# Patient Record
Sex: Female | Born: 1935 | Race: White | Hispanic: No | Smoking: Former smoker
Health system: Southern US, Community
[De-identification: ages and names within clinical notes are randomized; demographics above are authoritative.]

## PROBLEM LIST (undated history)

## (undated) DIAGNOSIS — I10 Essential (primary) hypertension: Secondary | ICD-10-CM

## (undated) DIAGNOSIS — Q782 Osteopetrosis: Secondary | ICD-10-CM

## (undated) DIAGNOSIS — J449 Chronic obstructive pulmonary disease, unspecified: Secondary | ICD-10-CM

## (undated) HISTORY — PX: TONSILLECTOMY: SUR1361

---

## 2009-07-02 ENCOUNTER — Emergency Department (HOSPITAL_COMMUNITY): Admission: EM | Admit: 2009-07-02 | Discharge: 2009-07-02 | Payer: Self-pay | Admitting: Emergency Medicine

## 2009-07-02 ENCOUNTER — Ambulatory Visit: Payer: Self-pay | Admitting: Vascular Surgery

## 2009-07-02 ENCOUNTER — Encounter (INDEPENDENT_AMBULATORY_CARE_PROVIDER_SITE_OTHER): Payer: Self-pay | Admitting: Emergency Medicine

## 2010-10-12 LAB — BASIC METABOLIC PANEL
Chloride: 101 mEq/L (ref 96–112)
GFR calc non Af Amer: >60 mL/min (ref >60)
Potassium: 3.9 mEq/L (ref 3.5–5.1)
Sodium: 132 mEq/L — ABNORMAL LOW (ref 135–145)

## 2010-10-12 LAB — DIFFERENTIAL
Lymphocytes Relative: 17 % (ref 12–46)
Lymphs Abs: 1.8 10*3/uL (ref 0.7–4.0)
Monocytes Absolute: 1 10*3/uL (ref 0.1–1.0)
Monocytes Relative: 10 % (ref 3–12)
Neutro Abs: 7.6 10*3/uL (ref 1.7–7.7)

## 2010-10-12 LAB — CBC
Hemoglobin: 13.3 g/dL (ref 12.0–15.0)
RBC: 4.3 MIL/uL (ref 3.87–5.11)

## 2021-01-21 ENCOUNTER — Encounter: Payer: Self-pay | Admitting: *Deleted

## 2021-01-21 ENCOUNTER — Emergency Department (INDEPENDENT_AMBULATORY_CARE_PROVIDER_SITE_OTHER)
Admission: EM | Admit: 2021-01-21 | Discharge: 2021-01-21 | Disposition: A | Discharge status: Home / Self Care | Payer: Medicare Other | Source: Home / Self Care | Attending: Family Medicine | Admitting: Family Medicine

## 2021-01-21 ENCOUNTER — Emergency Department (INDEPENDENT_AMBULATORY_CARE_PROVIDER_SITE_OTHER): Payer: Medicare Other

## 2021-01-21 DIAGNOSIS — M25532 Pain in left wrist: Secondary | ICD-10-CM

## 2021-01-21 DIAGNOSIS — M25541 Pain in joints of right hand: Secondary | ICD-10-CM | POA: Diagnosis not present

## 2021-01-21 DIAGNOSIS — W19XXXA Unspecified fall, initial encounter: Secondary | ICD-10-CM | POA: Diagnosis not present

## 2021-01-21 DIAGNOSIS — M1811 Unilateral primary osteoarthritis of first carpometacarpal joint, right hand: Secondary | ICD-10-CM

## 2021-01-21 DIAGNOSIS — M1120 Other chondrocalcinosis, unspecified site: Secondary | ICD-10-CM

## 2021-01-21 DIAGNOSIS — F05 Delirium due to known physiological condition: Secondary | ICD-10-CM

## 2021-01-21 HISTORY — DX: Osteopetrosis: Q78.2

## 2021-01-21 HISTORY — DX: Chronic obstructive pulmonary disease, unspecified: J44.9

## 2021-01-21 HISTORY — DX: Essential (primary) hypertension: I10

## 2021-01-21 MED ORDER — PREDNISONE 10 MG (21) PO TBPK: ORAL_TABLET | Freq: Every day | ORAL | 0 refills | Status: AC

## 2021-01-21 NOTE — Discharge Instructions (Addendum)
Limit use of hand Try ice for 20 minutes every 4 hours to reduce swelling, pain, and inflammation Take the prednisone pack as discussed.  You may take all of the pills at once each day Call or return if this does not improve

## 2021-01-21 NOTE — ED Triage Notes (Signed)
Patient reports falling about 3 days ago injuring her left hand/wrist. The site is red, warm and edematous. She is able to move her fingers. She has bruising at her right brow. No previous injury to the left hand. She is visiting here daughter and son in law from out of town. Ice applied. Her son in law reports her hand was not red or swollen yesterday. She was disoriented last night, she wound up in the wrong bed with her pants pulled down. She does not remember this. Denies any new medications.

## 2021-01-21 NOTE — ED Provider Notes (Signed)
[facility]    CSN: [id number] Arrival date & time: 01/21/21  1856      History   Chief Complaint Chief Complaint  Patient presents with   Hand Injury    left    HPI Angela Drake is a 86 y.o. female.   HPI  Angela Drake is here for left hand and wrist pain.  She awoke today with that acutely swollen, red, and very painful.  She has known arthritis in that hand.  She had a fall 2 or 3 days ago.  She is currently staying with her daughter and son-in-law.  The son-in-law brings her in.  She has a large bruise by her right eye.  She denies any headache, head injury, nausea vomiting, or increased trouble with ambulation or balance.  She comes in in a wheelchair.  She is frail and unsteady at the baseline.  She is on chronic oxygen from COPD.  She has been living in [place] with family, and is now moving to this area.  They are touring assisted living centers. Son said she is also had a couple episodes of acute confusion.  They woke up in the night to find her sitting onto the edge of their bed partially dressed.  The son does note that her oxygen compressor tripped a circuit and was off at that time.  I explained that hypoxia would make her confused, as well as being in a new place that is unfamiliar. Limited old records are available  Past Medical History:  Diagnosis Date   COPD (chronic obstructive pulmonary disease) (HCC)    Hypertension    Osteopetrosis     There are no problems to display for this patient.   Past Surgical History:  Procedure Laterality Date   TONSILLECTOMY      OB History   No obstetric history on file.      Home Medications    Prior to Admission medications   Medication Sig Start Date End Date Taking? Authorizing Provider  albuterol (VENTOLIN HFA) 108 (90 Base) MCG/ACT inhaler  12/15/16  Yes [provider]  dilTIAZem HCl Coated Beads (DILTIAZEM CD PO) Take by mouth.   Yes [provider]  losartan (COZAAR) 100 MG  tablet  01/21/17  Yes [provider]  predniSONE (STERAPRED UNI-PAK 21 TAB) 10 MG (21) TBPK tablet Take by mouth daily. tad 01/21/21  Yes Eustace Moore, MD  valACYclovir HCl (VALTREX PO) Take by mouth.   Yes [provider]    Family History History reviewed. No pertinent family history.  Social History Social History   Tobacco Use   Smoking status: Former    Pack years: 0.00    Types: Cigarettes   Smokeless tobacco: Never  Vaping Use   Vaping Use: Never used  Substance Use Topics   Alcohol use: Never   Drug use: Never     Allergies   Patient has no known allergies.   Review of Systems Review of Systems See HPI  Physical Exam Triage Vital Signs ED Triage Vitals  Enc Vitals Group     BP 01/21/21 0836 117/83     Pulse Rate 01/21/21 0836 92     Resp 01/21/21 0836 16     Temp 01/21/21 0836 98.6 F (37 C)     Temp Source 01/21/21 0836 Oral     SpO2 01/21/21 0836 93 %     Weight --      Height --      Head  Circumference --      Peak Flow --      Pain Score 01/21/21 0838 4     Pain Loc --      Pain Edu? --      Excl. in GC? --    No data found.  Updated Vital Signs BP 117/83 (BP Location: Right Arm)   Pulse 92   Temp 98.6 F (37 C) (Oral)   Resp 16   SpO2 93%       Physical Exam Constitutional:      General: She is not in acute distress.    Appearance: She is well-developed.     Comments: Patient is thin and frail.  Obvious kyphosis.  Examined in wheelchair.  Cradling left hand close to body  HENT:     Head: Normocephalic and atraumatic.  Eyes:     Conjunctiva/sclera: Conjunctivae normal.     Pupils: Pupils are equal, round, and reactive to light.     Comments: Bruise to the side of right eye.  PERRL.  Patient states vision is intact.  No palpable defect, minimal tenderness  Cardiovascular:     Rate and Rhythm: Normal rate.  Pulmonary:     Effort: Pulmonary effort is normal. No respiratory distress.  Abdominal:      General: There is no distension.     Palpations: Abdomen is soft.  Musculoskeletal:        General: Normal range of motion.     Cervical back: Normal range of motion.     Comments: There is swelling erythema and tenderness around the radial aspect of the wrist and distal thumb metacarpal.  Much pain with any palpation.  Pain with any thumb movement or wrist movement.  No bony tenderness  Skin:    General: Skin is warm and dry.     Findings: Erythema present.  Neurological:     Mental Status: She is alert.  Psychiatric:        Mood and Affect: Mood normal.        Behavior: Behavior normal.      UC Treatments / Results  Labs (all labs ordered are listed, but only abnormal results are displayed) Labs Reviewed - No data to display  EKG   Radiology DG Wrist Complete Left  Result Date: 01/21/2021 CLINICAL DATA:  Status post fall.  Pain. EXAM: LEFT WRIST - COMPLETE 3+ VIEW COMPARISON:  None. FINDINGS: No acute fracture or dislocation. No aggressive osseous lesion. Normal alignment. Generalized osteopenia. Severe osteoarthritis of the first [facility] joint with dorsal subluxation. Chondrocalcinosis of the TFCC as can be seen with CPPD. Soft tissue are unremarkable. No radiopaque foreign body or soft tissue emphysema. IMPRESSION: 1.  No acute osseous injury of the left wrist. 2. Severe osteoarthritis of the first [facility] joint with dorsal subluxation. Electronically Signed   By: Elige Ko   On: 01/21/2021 09:32    Procedures Procedures (including critical care time)  Medications Ordered in UC Medications - No data to display  Initial Impression / Assessment and Plan / UC Course  I have reviewed the triage vital signs and the nursing notes.  Pertinent labs & imaging results that were available during my care of the patient were reviewed by me and considered in my medical decision making (see chart for details).     Explained calcium pyrophosphate disease causing her some of her arthritic  changes.  Calcium deposits in the cartilage.  This is likely inflamed, possibly due to her fall.  I  think her wrist has an acute inflammatory reaction and not occult fracture We also discussed causes of acute confusion.  She is fine now.  She had taken lorazepam and was not on oxygen when she was confused.  Son will correct these. Final Clinical Impressions(s) / UC Diagnoses   Final diagnoses:  Pain, joint, hand, right  Primary osteoarthritis of first carpometacarpal joint of right hand  Calcium pyrophosphate deposition disease (CPPD)  Acute confusional state     Discharge Instructions      Limit use of hand Try ice for 20 minutes every 4 hours to reduce swelling, pain, and inflammation Take the prednisone pack as discussed.  You may take all of the pills at once each day Call or return if this does not improve     ED Prescriptions     Medication Sig Dispense Auth. Provider   predniSONE (STERAPRED UNI-PAK 21 TAB) 10 MG (21) TBPK tablet Take by mouth daily. tad 21 tablet Eustace Moore, MD      PDMP not reviewed this encounter.   Eustace Moore, MD 01/21/21 [phone]

## 2022-12-26 IMAGING — DX DG WRIST COMPLETE 3+V*L*
4 series · 4 of 4 positions shown · non-contrast
Comparison: None.

CLINICAL DATA: Status post fall.  Pain.

EXAM:
LEFT WRIST - COMPLETE 3+ VIEW

[wrist pa]
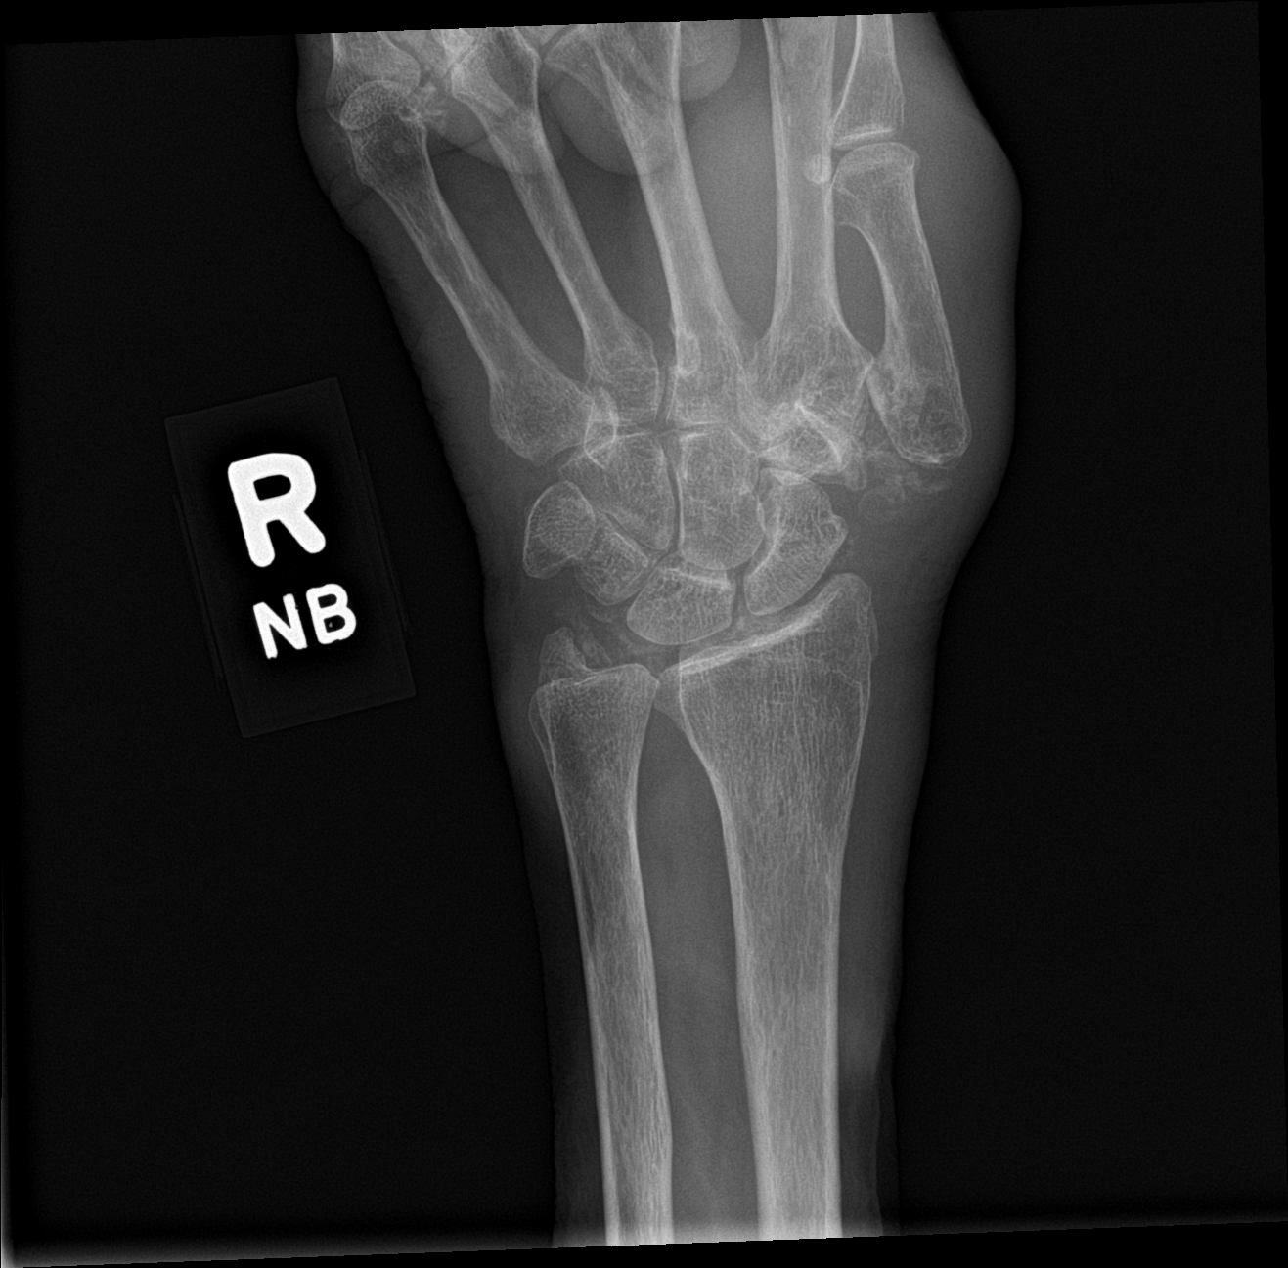

[wrist obl]
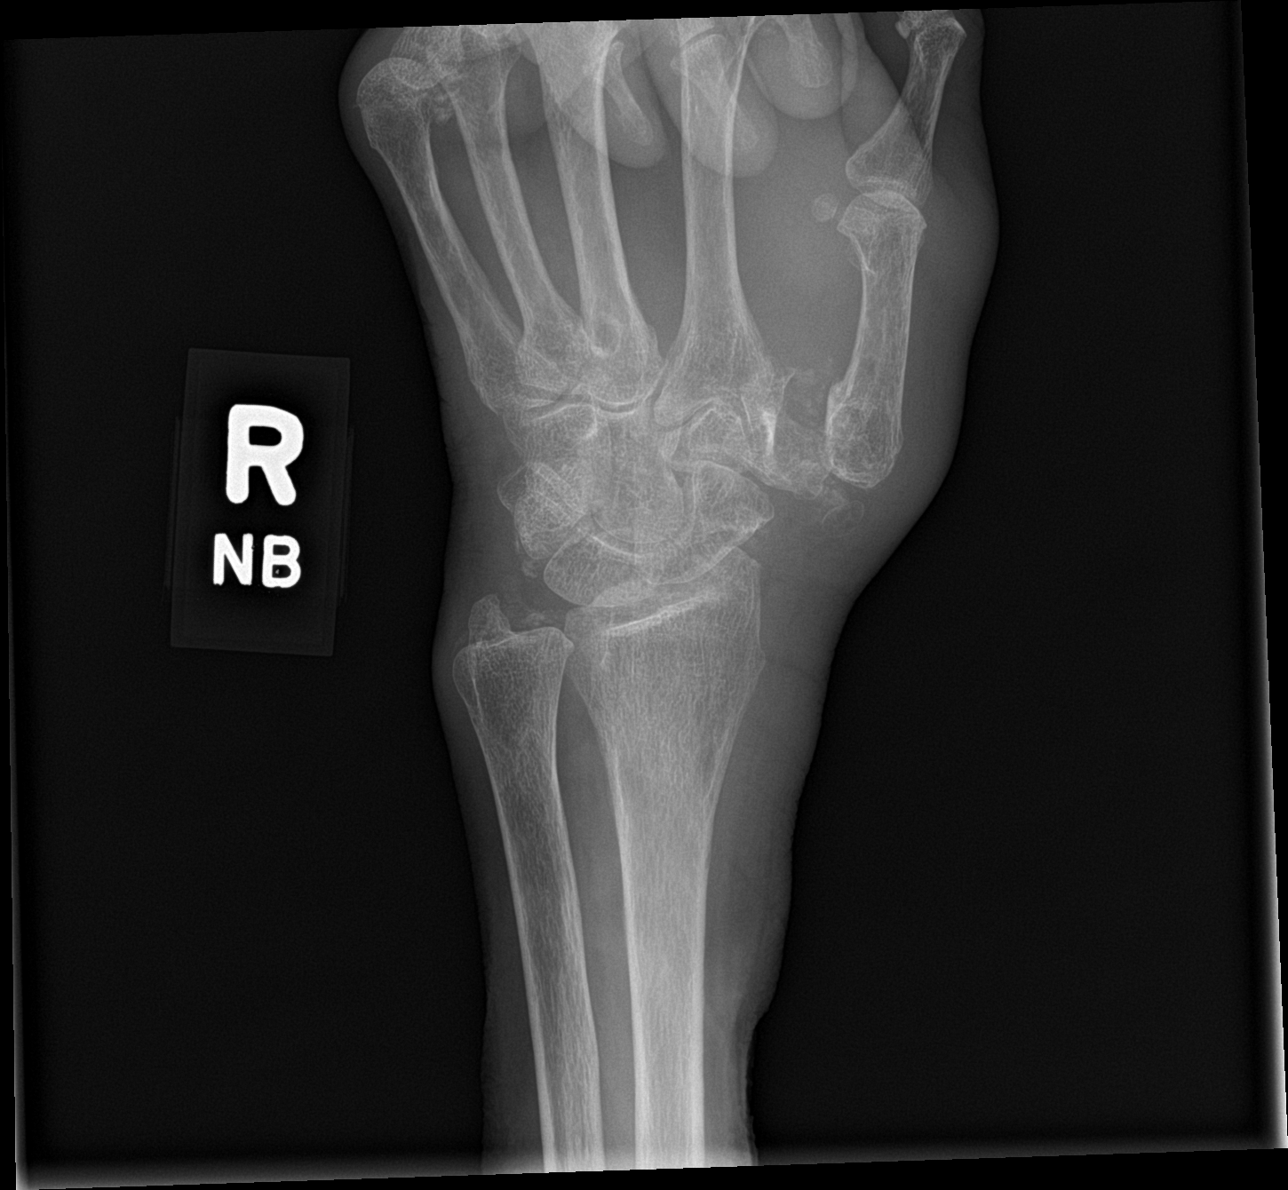

[wrist lat]
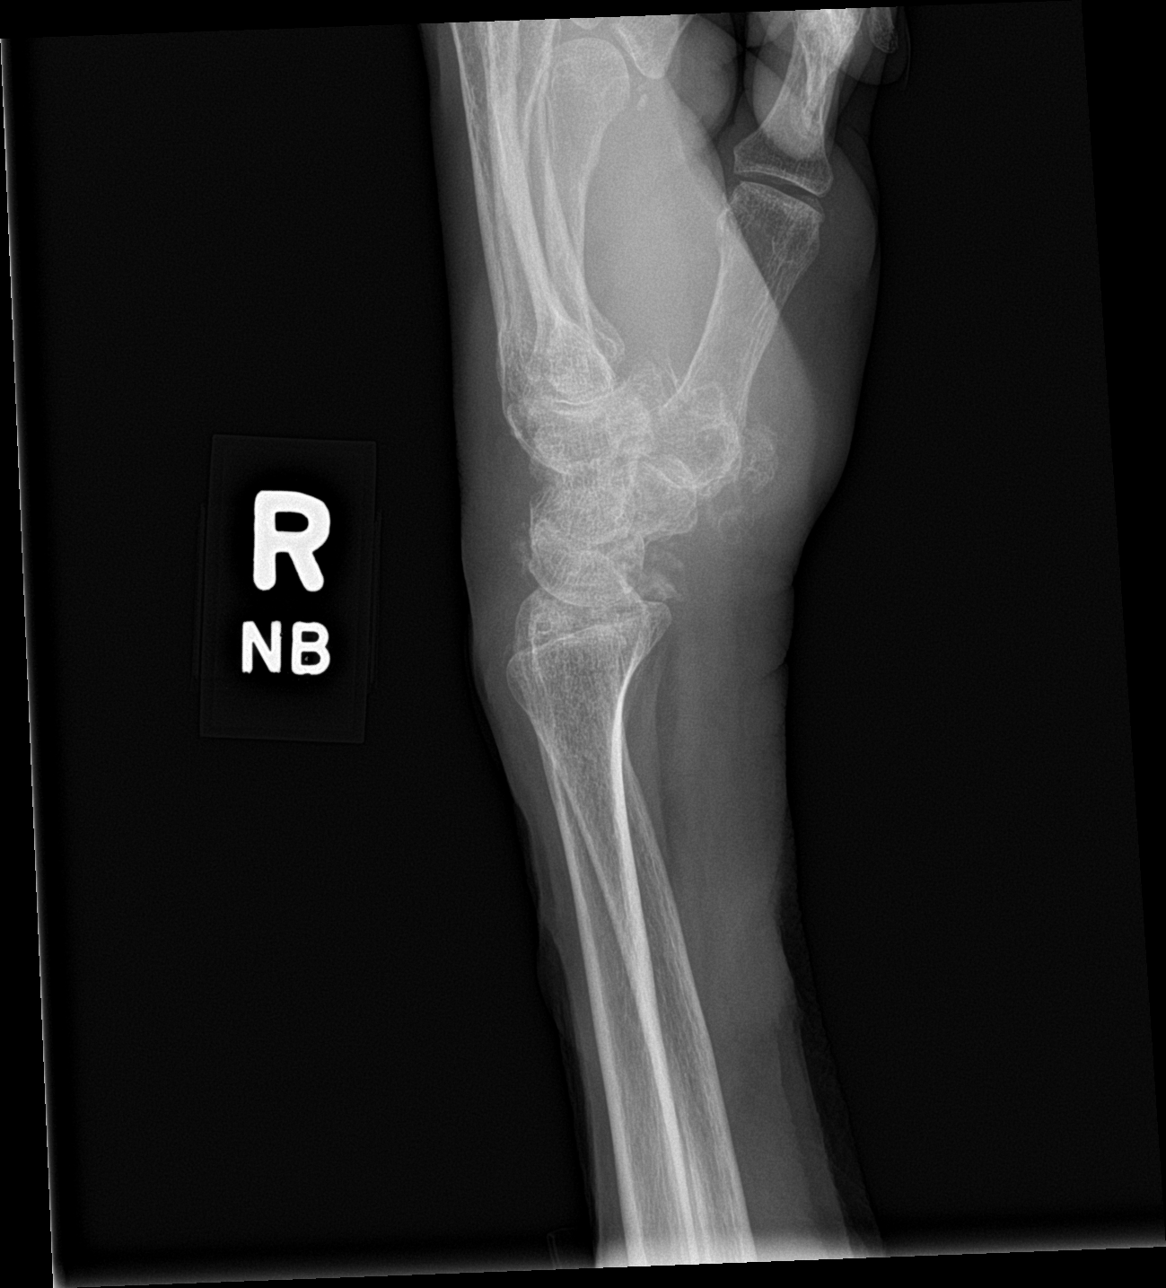

[wrist navicular]
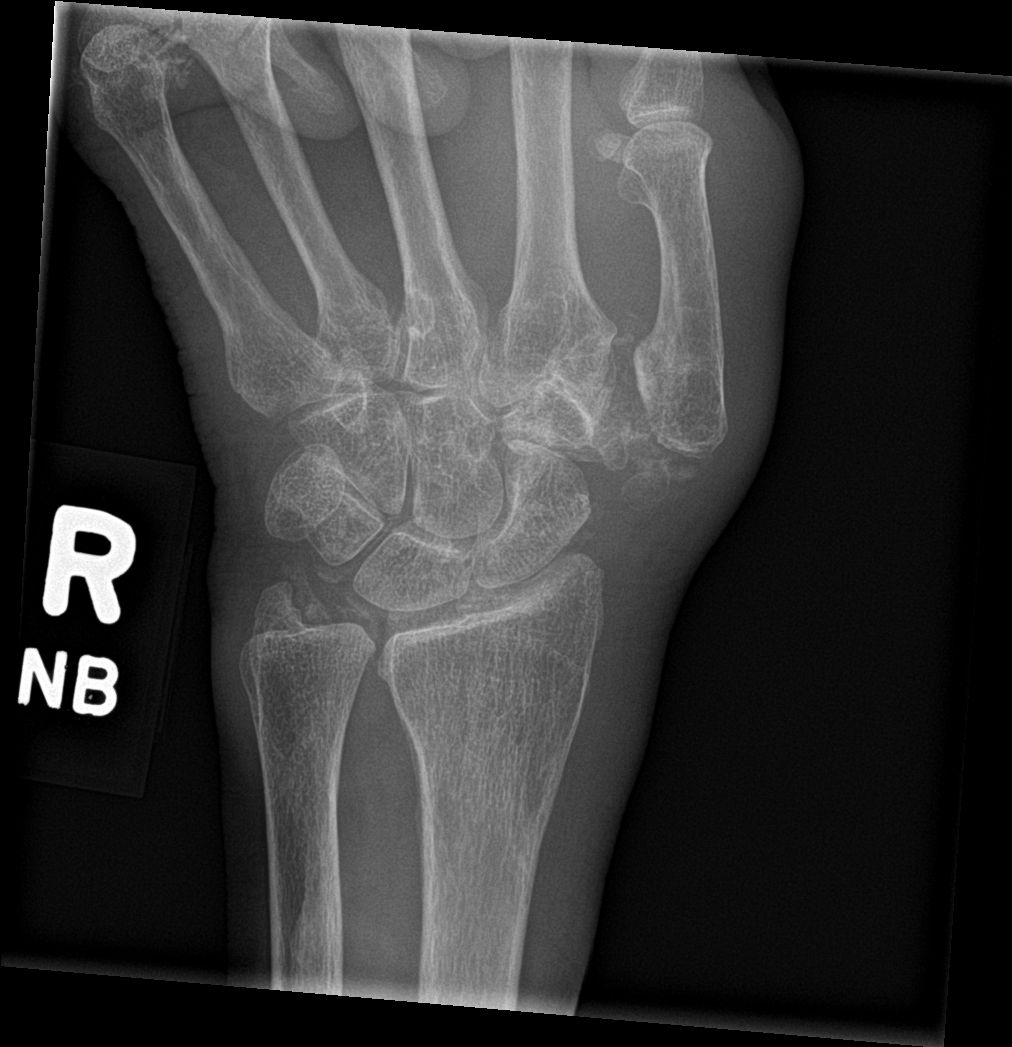

[4 of 4 positions shown; findings below may reference images not displayed]

FINDINGS: No acute fracture or dislocation. No aggressive osseous lesion.
Normal alignment. Generalized osteopenia. Severe osteoarthritis of
the first [facility] joint with dorsal subluxation. Chondrocalcinosis of
the TFCC as can be seen with CPPD.

Soft tissue are unremarkable. No radiopaque foreign body or soft
tissue emphysema.
IMPRESSION: 1.  No acute osseous injury of the left wrist.
2. Severe osteoarthritis of the first [facility] joint with dorsal
subluxation.
